# Patient Record
Sex: Male | Born: 1955 | Race: White | Hispanic: No | Marital: Married | Smoking: Never smoker
Health system: Southern US, Community
[De-identification: ages and names within clinical notes are randomized; demographics above are authoritative.]

## PROBLEM LIST (undated history)

## (undated) DIAGNOSIS — M542 Cervicalgia: Secondary | ICD-10-CM

## (undated) DIAGNOSIS — C801 Malignant (primary) neoplasm, unspecified: Secondary | ICD-10-CM

## (undated) DIAGNOSIS — I10 Essential (primary) hypertension: Secondary | ICD-10-CM

## (undated) HISTORY — PX: JOINT REPLACEMENT: SHX530

## (undated) HISTORY — PX: HERNIA REPAIR: SHX51

## (undated) HISTORY — PX: CARPAL TUNNEL RELEASE: SHX101

---

## 2010-08-15 LAB — APTT: aPTT: 32 seconds (ref 24–37)

## 2010-08-15 LAB — URINALYSIS, ROUTINE W REFLEX MICROSCOPIC
Bilirubin Urine: NEGATIVE
Hgb urine dipstick: NEGATIVE
Ketones, ur: NEGATIVE mg/dL
Nitrite: NEGATIVE
Protein, ur: NEGATIVE mg/dL
Specific Gravity, Urine: 1.007 (ref 1.005–1.030)
Urine Glucose, Fasting: NEGATIVE mg/dL
Urobilinogen, UA: 0.2 mg/dL (ref 0.0–1.0)
pH: 7 (ref 5.0–8.0)

## 2010-08-15 LAB — CBC
HCT: 42.5 % (ref 39.0–52.0)
Hemoglobin: 14.7 g/dL (ref 13.0–17.0)
MCH: 31.3 pg (ref 26.0–34.0)
MCHC: 34.6 g/dL (ref 30.0–36.0)
MCV: 90.6 fL (ref 78.0–100.0)
Platelets: 234 10*3/uL (ref 150–400)
RBC: 4.69 MIL/uL (ref 4.22–5.81)
RDW: 12.4 % (ref 11.5–15.5)
WBC: 7.1 10*3/uL (ref 4.0–10.5)

## 2010-08-15 LAB — COMPREHENSIVE METABOLIC PANEL
ALT: 31 U/L (ref 0–53)
AST: 19 U/L (ref 0–37)
Albumin: 4.3 g/dL (ref 3.5–5.2)
Alkaline Phosphatase: 65 U/L (ref 39–117)
BUN: 14 mg/dL (ref 6–23)
CO2: 29 mEq/L (ref 19–32)
Calcium: 9.8 mg/dL (ref 8.4–10.5)
Chloride: 105 mEq/L (ref 96–112)
Creatinine, Ser: 1.39 mg/dL (ref 0.4–1.5)
GFR calc Af Amer: >60 mL/min (ref >60)
GFR calc non Af Amer: 53 mL/min — ABNORMAL LOW (ref >60)
Glucose, Bld: 104 mg/dL — ABNORMAL HIGH (ref 70–99)
Potassium: 4.3 mEq/L (ref 3.5–5.1)
Sodium: 141 mEq/L (ref 135–145)
Total Bilirubin: 0.8 mg/dL (ref 0.3–1.2)
Total Protein: 7.5 g/dL (ref 6.0–8.3)

## 2010-08-15 LAB — SURGICAL PCR SCREEN
MRSA, PCR: NEGATIVE
Staphylococcus aureus: NEGATIVE

## 2010-08-15 LAB — PROTIME-INR
INR: 0.99 (ref 0.00–1.49)
Prothrombin Time: 13.3 seconds (ref 11.6–15.2)

## 2010-08-20 ENCOUNTER — Inpatient Hospital Stay (HOSPITAL_COMMUNITY)
Admission: RE | Admit: 2010-08-20 | Discharge: 2010-08-22 | Payer: Self-pay | Source: Home / Self Care | Attending: Orthopedic Surgery | Admitting: Orthopedic Surgery

## 2010-08-21 LAB — TYPE AND SCREEN
ABO/RH(D): O POS
Antibody Screen: NEGATIVE

## 2010-08-21 LAB — ABO/RH: ABO/RH(D): O POS

## 2010-08-22 LAB — BASIC METABOLIC PANEL
BUN: 7 mg/dL (ref 6–23)
BUN: 8 mg/dL (ref 6–23)
CO2: 29 mEq/L (ref 19–32)
CO2: 26 mEq/L (ref 19–32)
Calcium: 8.6 mg/dL (ref 8.4–10.5)
Calcium: 8.7 mg/dL (ref 8.4–10.5)
Chloride: 105 mEq/L (ref 96–112)
Chloride: 101 mEq/L (ref 96–112)
Creatinine, Ser: 1.19 mg/dL (ref 0.4–1.5)
Creatinine, Ser: 1.14 mg/dL (ref 0.4–1.5)
GFR calc Af Amer: >60 mL/min (ref >60)
GFR calc Af Amer: >60 mL/min (ref >60)
GFR calc non Af Amer: >60 mL/min (ref >60)
GFR calc non Af Amer: >60 mL/min (ref >60)
Glucose, Bld: 147 mg/dL — ABNORMAL HIGH (ref 70–99)
Glucose, Bld: 147 mg/dL — ABNORMAL HIGH (ref 70–99)
Potassium: 4.4 mEq/L (ref 3.5–5.1)
Potassium: 4 mEq/L (ref 3.5–5.1)
Sodium: 138 mEq/L (ref 135–145)
Sodium: 136 mEq/L (ref 135–145)

## 2010-08-22 LAB — CBC
HCT: 34.6 % — ABNORMAL LOW (ref 39.0–52.0)
HCT: 30.2 % — ABNORMAL LOW (ref 39.0–52.0)
Hemoglobin: 11.9 g/dL — ABNORMAL LOW (ref 13.0–17.0)
Hemoglobin: 10.7 g/dL — ABNORMAL LOW (ref 13.0–17.0)
MCH: 30.6 pg (ref 26.0–34.0)
MCH: 31.1 pg (ref 26.0–34.0)
MCHC: 34.4 g/dL (ref 30.0–36.0)
MCHC: 35.4 g/dL (ref 30.0–36.0)
MCV: 88.9 fL (ref 78.0–100.0)
MCV: 87.8 fL (ref 78.0–100.0)
Platelets: 211 10*3/uL (ref 150–400)
Platelets: 191 10*3/uL (ref 150–400)
RBC: 3.89 MIL/uL — ABNORMAL LOW (ref 4.22–5.81)
RBC: 3.44 MIL/uL — ABNORMAL LOW (ref 4.22–5.81)
RDW: 12.4 % (ref 11.5–15.5)
RDW: 12.4 % (ref 11.5–15.5)
WBC: 10.8 10*3/uL — ABNORMAL HIGH (ref 4.0–10.5)
WBC: 10 10*3/uL (ref 4.0–10.5)

## 2010-08-25 ENCOUNTER — Emergency Department (HOSPITAL_BASED_OUTPATIENT_CLINIC_OR_DEPARTMENT_OTHER)
Admission: EM | Admit: 2010-08-25 | Discharge: 2010-08-25 | Payer: Self-pay | Source: Home / Self Care | Admitting: Emergency Medicine

## 2010-08-29 NOTE — Op Note (Signed)
Antonio Wang, Antonio Wang                ACCOUNT NO.:  [id number]  MEDICAL RECORD NO.:  [id number]          PATIENT TYPE:  INP  LOCATION:  0004                         FACILITY:  [facility]  PHYSICIAN:  Ollen Gross, M.D.    DATE OF BIRTH:  1956/04/29  DATE OF PROCEDURE:  08/20/2010 DATE OF DISCHARGE:                              OPERATIVE REPORT   PREOPERATIVE DIAGNOSIS:  Osteoarthritis of the left knee.  POSTOPERATIVE DIAGNOSIS:  Osteoarthritis of the left knee.  PROCEDURE:  Left total-knee arthroplasty.  SURGEON:  Ollen Gross, M.D.  ASSISTANT:  Alexzandrew L. Perkins, P.A.C.  ANESTHESIA:  Spinal.  ESTIMATED BLOOD LOSS:  Minimal.  DRAINS:  Hemovac x1.  TOURNIQUET TIME:  47 minutes at 300 mmHg.  COMPLICATIONS:  None.  CONDITION:  Stable to recovery.  BRIEF CLINICAL NOTE:  Mr. Cookston is a 55 year old male with advanced arthritis of the left knee with progressively worsening pain and dysfunction.  He has failed nonoperative management and presents for a total-knee arthroplasty.  PROCEDURE IN DETAIL:  After successful administration of spinal anesthetic, a tourniquet was placed high on the left thigh and the left lower extremity was prepped and draped in the usual sterile fashion. Extremity was wrapped in Esmarch, knee flexed, tourniquet inflated to 300 mmHg.  Midline incision was made with a 10 blade through subcutaneous tissue to the level of the extensor mechanism.  A fresh blade was used to make a medial parapatellar arthrotomy.  Soft tissue on the proximal and medial tibia was subperiosteally elevated to the joint line with the knife and into the semimembranosus bursa with a Cobb elevator.  Soft tissue laterally was elevated with attention being paid to avoid the patellar tendon on the tibial tubercle.  The patella was everted, knee flexed 90 degrees and ACL and PCL removed.  Drill was used to create a starting hole in the distal femur and the canal was thoroughly  irrigated.  The 5 degrees left valgus alignment guide was placed and the distal femoral cutting block was placed to remove 11 mm off the distal femur.  Distal femoral resection was made with an oscillating saw.  Sizing guide was placed.  Size 5 was most appropriate. Rotation was marked off the epicondylar axis.  Size 5 cutting block was placed and the anterior, posterior and chamfer cuts were made.  Tibia subluxed forward and menisci removed.  Extramedullary tibial alignment guide was placed referencing proximally at the medial aspect of the tibial tubercle and distally along the second metatarsal axis and tibial crest.  Block was pinned to remove 2 mm off the more deficient medial side.  Tibial resection was made with an oscillating saw.  Size 5 was the most appropriate tibial component and the proximal tibia was prepared with the modular drill and keel punch for the size 5.  Femoral preparation was completed with the intercondylar cut.  Size 5 mobile bearing tibial trial, size 5 posterior stabilized femoral trial and a 10-mm posterior stabilized rotating platform insert trial was placed.  With the 10 there was a fair amount of laxity and we ended up going up to a 15, which  allowed for full extension with excellent varus-valgus and anterior-posterior balance throughout full range of motion.  Patella was everted, thickness measured to be 25 mm.  Freehand resection was taken to 32 mm, 41 template was placed, lug holes were drilled, trial patella was placed and it tracks normally.  Osteophytes were removed off the posterior femur with the trial in place.  All trials were removed and the cut bone surfaces were prepared with pulsatile lavage.  The cement was mixed and once ready for implantation the size 5 mobile bearing tibial tray, size 5 posterior stabilized femur and 41 patella were cemented into place and the patella was held with a clamp.  Trial 15-mm insert was placed, knee held in  full extension and all extruded cement was removed.  When the cement was fully hardened, then the permanent 15 mm posterior stabilized rotating platform insert was placed in the tibial tray.  The wound was copiously irrigated with saline solution and the arthrotomy was closed over a Hemovac drain with interrupted #1 PDS.  Flexion against gravity was 140 degrees and the patella tracks normally.  Subcutaneous was closed with interrupted 2-0 Vicryl and subcuticular running 4-0 Monocryl.  The catheter for the Marcaine pain pump was placed and the pump was initiated.  Incision was cleaned and dried and Steri-Strips and a bulky sterile dressing were applied.  He was then placed into a knee immobilizer, awakened and transferred to recovery in stable condition.     Ollen Gross, M.D.     FA/MEDQ  D:  08/20/2010  T:  08/20/2010  Job:  [id number]  Electronically Signed by Ollen Gross M.D. on 08/29/2010 03:36:22 PM

## 2010-08-29 NOTE — H&P (Signed)
  NAMEGEROLD, Antonio Wang                ACCOUNT NO.:  [id number]  MEDICAL RECORD NO.:  [id number]          PATIENT TYPE:  INP  LOCATION:  0004                         FACILITY:  [facility]  PHYSICIAN:  Ollen Gross, M.D.    DATE OF BIRTH:  July 10, 1956  DATE OF ADMISSION:  08/20/2010 DATE OF DISCHARGE:                             HISTORY & PHYSICAL   CHIEF COMPLAINT:  Left knee pain.  HISTORY OF PRESENT ILLNESS:  The patient is a 55 year old male who has been seen by Dr. Lequita Halt for ongoing left knee pain.  He has known progressive arthritis.  X-rays show that he has bone-on-bone changes in the medial compartment and significant chondrocalcinosis that has progressively gotten worse with increasing dysfunction.  He was felt to be a good candidate and was subsequently admitted to the hospital.  ALLERGIES:  PENICILLIN.  CURRENT MEDICATIONS:  Lipitor and meloxicam.  PAST MEDICAL HISTORY:  Impaired vision, history of bronchitis, history of pneumonia, sleep apnea, uses CPAP, elevated cholesterol, reflux disease, degenerative disk disease, childhood illnesses of measles.  PAST SURGICAL HISTORY:  Removal of torn meniscus left knee.  FAMILY HISTORY:  Father with hypertension, cholesterol, COPD, heart attack.  Mother with hypertension, heart attack and atherosclerosis. Brother with fibromyalgia.  SOCIAL HISTORY:  Married.  Sport and exercise psychologist.  Current smoker, two packs a day for 25 years.  Three beers a day with more on the weekends.  Four children.  Does have a caregiver lined up.  REVIEW OF SYSTEMS:  GENERAL:  No fevers, chills or night sweats. NEUROLOGIC:  No seizures, syncope or paralysis.  RESPIRATORY:  No shortness of breath, productive cough or hemoptysis.  CARDIOVASCULAR: No chest pain, no orthopnea.  GASTROINTESTINAL:  No nausea, vomiting, diarrhea or constipation.  GENITOURINARY:  No dysuria, hematuria or discharge.  MUSCULOSKELETAL:  Left knee.  PHYSICAL EXAMINATION:  VITAL SIGNS:   Pulse 76, respirations 14, blood pressure 120/82. GENERAL:  A 55 year old male, well-nourished, well-developed.  No acute distress.  He is alert, oriented and cooperative.  Good historian. Overweight. HEENT:  Normocephalic, atraumatic.  Pupils are equal, round and reactive.  EOMs intact.  He does not wear glasses. NECK:  Supple. CHEST:  Clear. HEART:  Regular rate and rhythm without murmur.  S1-S2 noted. ABDOMEN:  Soft, nontender.  Bowel sounds are present. RECTAL/BREAST/GENITALIA:  Not done as not pertinent to present illness. EXTREMITIES:  Left knee slight varus, range of motion 5 to 120, marked crepitus, tender more medial than lateral.  IMPRESSION:  Osteoarthritis of the left knee.  PLAN:  The patient was admitted to [facility] to undergo a left total-knee replacement arthroplasty.  Surgery will be performed by Dr. Ollen Gross.     [name], P.A.C.   ______________________________ Ollen Gross, M.D.    ALP/MEDQ  D:  08/20/2010  T:  08/20/2010  Job:  [id number] Electronically Signed by Patrica Duel P.A.C. on 08/23/2010 10:32:18 AM Electronically Signed by Ollen Gross M.D. on 08/29/2010 03:36:18 PM

## 2010-09-12 NOTE — Discharge Summary (Addendum)
Antonio Wang, Antonio Wang                ACCOUNT NO.:  [id number]  MEDICAL RECORD NO.:  [id number]           PATIENT TYPE:  I  LOCATION:  1619                         FACILITY:  [facility]  PHYSICIAN:  Ollen Gross, M.D.    DATE OF BIRTH:  [birth year]  DATE OF ADMISSION:  08/20/2010 DATE OF DISCHARGE:  08/22/2010                              DISCHARGE SUMMARY   ADMITTING DIAGNOSES: 1. Osteoarthritis, left knee. 2. Impaired vision. 3. History of bronchitis. 4. History of pneumonia. 5. Sleep apnea. 6. Elevated cholesterol. 7. Reflux disease. 8. Degenerative disk disease. 9. Childhood illnesses, measles.  DISCHARGE DIAGNOSES: 1. Osteoarthritis, left knee, status post left total knee replacement     arthroplasty. 2. Impaired vision. 3. History of bronchitis. 4. History of pneumonia. 5. Sleep apnea. 6. Elevated cholesterol. 7. Reflux disease. 8. Degenerative disk disease. 9. Childhood illnesses, measles.  PROCEDURE:  August 20, 2010, left total knee.  SURGEON:  Ollen Gross, M.D.  ASSISTANT:  Alexzandrew L. Perkins, P.A.C.  ANESTHESIA:  Spinal anesthesia.  TOURNIQUET TIME:  47 minutes.  CONSULTS:  None.  BRIEF HISTORY:  [name] is a 55 year old male with advanced arthritis of left knee with progressive worsening pain and dysfunction, failed nonoperative management, now presents for total knee arthroplasty.  LABORATORY DATA AND DIAGNOSTIC STUDIES:  Preop CBC showed hemoglobin of 14.7, hematocrit 42.5, white cell count 7.1, platelets 234,000.  Postop hemoglobin 11.9, last hemoglobin and hematocrit 10.7 and 30.2.  PT/INR 13.3 and 0.99, with a PTT of 32 on admission.  Chem panel on admission all within normal limits.  Serial BMETs were followed for electrolytes. Electrolytes all remained within normal limits x2.  Glucose did go up from 104 to 147.  Preop UA negative.  Blood type O+.  Nasal swabs were negative for Staphylococcus aureus and negative for MRSA.  EKG on  August 14, 2010; normal sinus rhythm, normal EKG, confirmed by Dr. [place] Bing.  Chest x-ray, two-view chest, on August 14, 2010; no acute cardiopulmonary process, bronchitic markings.  HOSPITAL COURSE:  The patient was admitted to [facility], taken to the OR, underwent above-stated procedure without complication. The patient tolerated the procedure well, later transferred to recovery room, then orthopedic floor, started on p.o. and IV analgesic pain control following surgery.  Overnight services did change his IV medications for better pain control.  We changed his p.o. medications on the morning of day #1.  We discontinued his Foley and had decent urinary output.  Hemoglobin was 11.9.  Hemovac drain placed in the surgery was pulled.  He was placed on Xarelto for DVT prophylaxis.  We started back his CPAP at night.  Here, he did very well with physical therapy on day #1.  By day #2, he was doing extremely well.  We did increase his pain pills though for a little bit stronger pain medication.  He was considering going home so what we did was allowed him therapy that morning and early afternoon.  We changed his dressing on day #2 also. Incision looked good.  Hemoglobin was 10.7.  He did very well with this therapy and later that day,  he was evaluated and discharged home.  DISCHARGE/PLAN:  The patient was discharged home on August 22, 2010.  DISCHARGE DIAGNOSIS:  Please see above.  DISCHARGE MEDICATIONS:  Oxy-IR 10, Robaxin 500 mg, Xarelto 10 mg. Continue home medications with Lipitor and Nasonex.  DIET:  As tolerated.  FOLLOWUP:  The patient needs to follow up in 2 weeks from date of surgery on Tuesday, February 7.  ACTIVITY:  Weightbearing as tolerated, total knee protocol.  DISPOSITION:  Home.  CONDITION ON DISCHARGE:  Improved.     [name], P.A.C.   ______________________________ Ollen Gross, M.D.   ALP/MEDQ  D:  09/04/2010  T:   09/04/2010  Job:  [id number]  Electronically Signed by Patrica Duel P.A.C. on 11/08/2010 11:10:33 AM Electronically Signed by Ollen Gross M.D. on 11/09/2010 09:00:45 AM

## 2010-09-17 ENCOUNTER — Ambulatory Visit: Payer: BC Managed Care – PPO | Attending: Orthopedic Surgery | Admitting: Physical Therapy

## 2010-09-17 DIAGNOSIS — M25669 Stiffness of unspecified knee, not elsewhere classified: Secondary | ICD-10-CM | POA: Insufficient documentation

## 2010-09-17 DIAGNOSIS — IMO0001 Reserved for inherently not codable concepts without codable children: Secondary | ICD-10-CM | POA: Insufficient documentation

## 2010-09-17 DIAGNOSIS — M25569 Pain in unspecified knee: Secondary | ICD-10-CM | POA: Insufficient documentation

## 2010-09-17 DIAGNOSIS — R262 Difficulty in walking, not elsewhere classified: Secondary | ICD-10-CM | POA: Insufficient documentation

## 2010-09-17 DIAGNOSIS — Z96659 Presence of unspecified artificial knee joint: Secondary | ICD-10-CM | POA: Insufficient documentation

## 2010-09-19 ENCOUNTER — Ambulatory Visit: Payer: BC Managed Care – PPO

## 2010-09-24 ENCOUNTER — Ambulatory Visit: Payer: BC Managed Care – PPO

## 2010-09-26 ENCOUNTER — Ambulatory Visit: Payer: BC Managed Care – PPO | Admitting: Rehabilitation

## 2010-09-27 ENCOUNTER — Ambulatory Visit: Payer: BC Managed Care – PPO | Attending: Orthopedic Surgery | Admitting: Rehabilitation

## 2010-09-27 DIAGNOSIS — M25669 Stiffness of unspecified knee, not elsewhere classified: Secondary | ICD-10-CM | POA: Insufficient documentation

## 2010-09-27 DIAGNOSIS — M25569 Pain in unspecified knee: Secondary | ICD-10-CM | POA: Insufficient documentation

## 2010-09-27 DIAGNOSIS — Z96659 Presence of unspecified artificial knee joint: Secondary | ICD-10-CM | POA: Insufficient documentation

## 2010-09-27 DIAGNOSIS — R262 Difficulty in walking, not elsewhere classified: Secondary | ICD-10-CM | POA: Insufficient documentation

## 2010-09-27 DIAGNOSIS — IMO0001 Reserved for inherently not codable concepts without codable children: Secondary | ICD-10-CM | POA: Insufficient documentation

## 2010-10-03 ENCOUNTER — Ambulatory Visit: Payer: BC Managed Care – PPO | Admitting: Physical Therapy

## 2010-10-10 ENCOUNTER — Ambulatory Visit: Payer: BC Managed Care – PPO | Admitting: Rehabilitation

## 2010-10-17 ENCOUNTER — Ambulatory Visit: Payer: BC Managed Care – PPO

## 2018-04-14 ENCOUNTER — Other Ambulatory Visit: Payer: Self-pay | Admitting: Neurological Surgery

## 2018-05-26 NOTE — Pre-Procedure Instructions (Signed)
Antonio Wang  05/26/2018      [id number] - [place], [place] - [address] [place] [address] Phone: [phone] Fax: [phone]  [place], [place] - [place] [address] Phone: [phone] Fax: [phone]    Your procedure is scheduled on Thursday November 14.  Report to [facility] Admitting at 7:00 A.M.  Call this number if you have problems the morning of surgery:  [phone]   Remember:  Do not eat or drink after midnight.    Take these medicines the morning of surgery with A SIP OF WATER: NONE  7 days prior to surgery STOP taking any Aspirin(unless otherwise instructed by your surgeon), Aleve, Naproxen, Ibuprofen, Motrin, Advil, Goody's, BC's, all herbal medications, fish oil, and all vitamins     Do not wear jewelry, make-up or nail polish.  Do not wear lotions, powders, or perfumes, or deodorant.  Do not shave 48 hours prior to surgery.  Men may shave face and neck.  Do not bring valuables to the hospital.  [facility] is not responsible for any belongings or valuables.  Contacts, dentures or bridgework may not be worn into surgery.  Leave your suitcase in the car.  After surgery it may be brought to your room.  For patients admitted to the hospital, discharge time will be determined by your treatment team.  Patients discharged the day of surgery will not be allowed to drive home.   Special instructions:    [facility]- Preparing For Surgery  Before surgery, you can play an important role. Because skin is not sterile, your skin needs to be as free of germs as possible. You can reduce the number of germs on your skin by washing with CHG (chlorahexidine gluconate) Soap before surgery.  CHG is an antiseptic cleaner which kills germs and bonds with the skin to continue killing germs even after washing.     Oral Hygiene is also important to reduce your risk of infection.  Remember - BRUSH YOUR TEETH THE MORNING OF SURGERY WITH YOUR REGULAR TOOTHPASTE  Please do not use if you have an allergy to CHG or antibacterial soaps. If your skin becomes reddened/irritated stop using the CHG.  Do not shave (including legs and underarms) for at least 48 hours prior to first CHG shower. It is OK to shave your face.  Please follow these instructions carefully.   1. Shower the NIGHT BEFORE SURGERY and the MORNING OF SURGERY with CHG.   2. If you chose to wash your hair, wash your hair first as usual with your normal shampoo.  3. After you shampoo, rinse your hair and body thoroughly to remove the shampoo.  4. Use CHG as you would any other liquid soap. You can apply CHG directly to the skin and wash gently with a scrungie or a clean washcloth.   5. Apply the CHG Soap to your body ONLY FROM THE NECK DOWN.  Do not use on open wounds or open sores. Avoid contact with your eyes, ears, mouth and genitals (private parts). Wash Face and genitals (private parts)  with your normal soap.  6. Wash thoroughly, paying special attention to the area where your surgery will be performed.  7. Thoroughly rinse your body with warm water from the neck down.  8. DO NOT shower/wash with your normal  soap after using and rinsing off the CHG Soap.  9. Pat yourself dry with a CLEAN TOWEL.  10. Wear CLEAN PAJAMAS to bed the night before surgery, wear comfortable clothes the morning of surgery  11. Place CLEAN SHEETS on your bed the night of your first shower and DO NOT SLEEP WITH PETS.    Day of Surgery:  Do not apply any deodorants/lotions.  Please wear clean clothes to the hospital/surgery center.   Remember to brush your teeth WITH YOUR REGULAR TOOTHPASTE.    Please read over the following fact sheets that you were given. Coughing and Deep Breathing, MRSA Information and Surgical Site Infection  Prevention

## 2018-05-27 ENCOUNTER — Inpatient Hospital Stay (HOSPITAL_COMMUNITY)
Admission: RE | Admit: 2018-05-27 | Discharge: 2018-05-27 | Disposition: A | Discharge status: Home / Self Care | Payer: Self-pay | Source: Ambulatory Visit

## 2018-06-02 ENCOUNTER — Encounter (HOSPITAL_COMMUNITY)
Admission: RE | Admit: 2018-06-02 | Discharge: 2018-06-02 | Disposition: A | Discharge status: Home / Self Care | Payer: 59 | Source: Ambulatory Visit | Attending: Neurological Surgery | Admitting: Neurological Surgery

## 2018-06-02 ENCOUNTER — Other Ambulatory Visit: Payer: Self-pay

## 2018-06-02 ENCOUNTER — Encounter (HOSPITAL_COMMUNITY): Payer: Self-pay

## 2018-06-02 DIAGNOSIS — Z01818 Encounter for other preprocedural examination: Secondary | ICD-10-CM | POA: Diagnosis present

## 2018-06-02 DIAGNOSIS — M542 Cervicalgia: Secondary | ICD-10-CM | POA: Diagnosis not present

## 2018-06-02 HISTORY — DX: Essential (primary) hypertension: I10

## 2018-06-02 HISTORY — DX: Malignant (primary) neoplasm, unspecified: C80.1

## 2018-06-02 LAB — BASIC METABOLIC PANEL
ANION GAP: 6 (ref 5–15)
BUN: 17 mg/dL (ref 8–23)
CALCIUM: 9.4 mg/dL (ref 8.9–10.3)
CO2: 26 mmol/L (ref 22–32)
Chloride: 107 mmol/L (ref 98–111)
Creatinine, Ser: 1.39 mg/dL — ABNORMAL HIGH (ref 0.61–1.24)
GFR calc Af Amer: >60 mL/min (ref >60)
GFR calc non Af Amer: 52 mL/min — ABNORMAL LOW (ref >60)
Glucose, Bld: 129 mg/dL — ABNORMAL HIGH (ref 70–99)
Potassium: 4 mmol/L (ref 3.5–5.1)
Sodium: 139 mmol/L (ref 135–145)

## 2018-06-02 LAB — CBC WITH DIFFERENTIAL/PLATELET
ABS IMMATURE GRANULOCYTES: 0.04 10*3/uL (ref 0.00–0.07)
BASOS ABS: 0.1 10*3/uL (ref 0.0–0.1)
Basophils Relative: 1 %
Eosinophils Absolute: 0.4 10*3/uL (ref 0.0–0.5)
Eosinophils Relative: 5 %
HCT: 41.3 % (ref 39.0–52.0)
Hemoglobin: 13.8 g/dL (ref 13.0–17.0)
IMMATURE GRANULOCYTES: 1 %
Lymphocytes Relative: 25 %
Lymphs Abs: 2 10*3/uL (ref 0.7–4.0)
MCH: 31.5 pg (ref 26.0–34.0)
MCHC: 33.4 g/dL (ref 30.0–36.0)
MCV: 94.3 fL (ref 80.0–100.0)
Monocytes Absolute: 0.8 10*3/uL (ref 0.1–1.0)
Monocytes Relative: 11 %
NEUTROS ABS: 4.7 10*3/uL (ref 1.7–7.7)
NEUTROS PCT: 57 %
NRBC: 0 % (ref 0.0–0.2)
PLATELETS: 204 10*3/uL (ref 150–400)
RBC: 4.38 MIL/uL (ref 4.22–5.81)
RDW: 12.5 % (ref 11.5–15.5)
WBC: 8 10*3/uL (ref 4.0–10.5)

## 2018-06-02 LAB — PROTIME-INR
INR: 0.96
PROTHROMBIN TIME: 12.7 s (ref 11.4–15.2)

## 2018-06-02 LAB — SURGICAL PCR SCREEN
MRSA, PCR: NEGATIVE
Staphylococcus aureus: NEGATIVE

## 2018-06-02 NOTE — Progress Notes (Signed)
PCP: Charlcie Cradle  Cardiologist: denies  DM: denies  SA: yes, wears CPAP  Stress Test: >15 years   Pt denies SOB, cough, fever, chest pain  Pt states understanding of instructions given for day of surgery.

## 2018-06-11 ENCOUNTER — Encounter (HOSPITAL_COMMUNITY): Payer: Self-pay

## 2018-06-11 ENCOUNTER — Inpatient Hospital Stay (HOSPITAL_COMMUNITY)
Admission: RE | Disposition: A | Discharge status: Home / Self Care | Payer: Self-pay | Source: Home / Self Care | Attending: Neurological Surgery

## 2018-06-11 ENCOUNTER — Inpatient Hospital Stay (HOSPITAL_COMMUNITY): Payer: 59 | Admitting: Anesthesiology

## 2018-06-11 ENCOUNTER — Inpatient Hospital Stay (HOSPITAL_COMMUNITY): Payer: 59 | Admitting: Physician Assistant

## 2018-06-11 ENCOUNTER — Inpatient Hospital Stay (HOSPITAL_COMMUNITY)
Admission: RE | Admit: 2018-06-11 | Discharge: 2018-06-12 | DRG: 472 | Disposition: A | Discharge status: Home / Self Care | Payer: 59 | Attending: Neurological Surgery | Admitting: Neurological Surgery

## 2018-06-11 ENCOUNTER — Other Ambulatory Visit: Payer: Self-pay

## 2018-06-11 ENCOUNTER — Observation Stay (HOSPITAL_COMMUNITY): Payer: 59

## 2018-06-11 DIAGNOSIS — Z85820 Personal history of malignant melanoma of skin: Secondary | ICD-10-CM

## 2018-06-11 DIAGNOSIS — Z885 Allergy status to narcotic agent status: Secondary | ICD-10-CM

## 2018-06-11 DIAGNOSIS — Z79899 Other long term (current) drug therapy: Secondary | ICD-10-CM

## 2018-06-11 DIAGNOSIS — F17290 Nicotine dependence, other tobacco product, uncomplicated: Secondary | ICD-10-CM | POA: Diagnosis present

## 2018-06-11 DIAGNOSIS — M4802 Spinal stenosis, cervical region: Principal | ICD-10-CM | POA: Diagnosis present

## 2018-06-11 DIAGNOSIS — Z981 Arthrodesis status: Secondary | ICD-10-CM

## 2018-06-11 DIAGNOSIS — Z882 Allergy status to sulfonamides status: Secondary | ICD-10-CM

## 2018-06-11 DIAGNOSIS — M50221 Other cervical disc displacement at C4-C5 level: Secondary | ICD-10-CM | POA: Diagnosis present

## 2018-06-11 DIAGNOSIS — Z96659 Presence of unspecified artificial knee joint: Secondary | ICD-10-CM | POA: Diagnosis present

## 2018-06-11 DIAGNOSIS — M47812 Spondylosis without myelopathy or radiculopathy, cervical region: Secondary | ICD-10-CM | POA: Diagnosis present

## 2018-06-11 DIAGNOSIS — Z419 Encounter for procedure for purposes other than remedying health state, unspecified: Secondary | ICD-10-CM

## 2018-06-11 DIAGNOSIS — Z6841 Body Mass Index (BMI) 40.0 and over, adult: Secondary | ICD-10-CM

## 2018-06-11 DIAGNOSIS — I10 Essential (primary) hypertension: Secondary | ICD-10-CM | POA: Diagnosis present

## 2018-06-11 DIAGNOSIS — Z880 Allergy status to penicillin: Secondary | ICD-10-CM

## 2018-06-11 DIAGNOSIS — M2578 Osteophyte, vertebrae: Secondary | ICD-10-CM | POA: Diagnosis present

## 2018-06-11 HISTORY — PX: ANTERIOR CERVICAL DECOMP/DISCECTOMY FUSION: SHX1161

## 2018-06-11 HISTORY — DX: Cervicalgia: M54.2

## 2018-06-11 SURGERY — ANTERIOR CERVICAL DECOMPRESSION/DISCECTOMY FUSION 3 LEVELS
Anesthesia: General

## 2018-06-11 MED ORDER — THROMBIN 5000 UNITS EX SOLR
OROMUCOSAL | Status: DC | PRN
  Administered 2018-06-11 (×2): via TOPICAL

## 2018-06-11 MED ORDER — ONDANSETRON HCL 4 MG PO TABS
4.0000 mg | ORAL_TABLET | Freq: Four times a day (QID) | ORAL | Status: DC | PRN
  Administered 2018-06-11: 4 mg via ORAL
  Filled 2018-06-11: qty 1

## 2018-06-11 MED ORDER — PHENYLEPHRINE 40 MCG/ML (10ML) SYRINGE FOR IV PUSH (FOR BLOOD PRESSURE SUPPORT)
PREFILLED_SYRINGE | INTRAVENOUS | Status: AC
  Filled 2018-06-11: qty 10

## 2018-06-11 MED ORDER — ROCURONIUM BROMIDE 50 MG/5ML IV SOSY
PREFILLED_SYRINGE | INTRAVENOUS | Status: AC
  Filled 2018-06-11: qty 5

## 2018-06-11 MED ORDER — SODIUM CHLORIDE 0.9 % IV SOLN
INTRAVENOUS | Status: DC | PRN
  Administered 2018-06-11: 09:00:00

## 2018-06-11 MED ORDER — THROMBIN (RECOMBINANT) 20000 UNITS EX SOLR
CUTANEOUS | Status: AC
  Filled 2018-06-11: qty 20000

## 2018-06-11 MED ORDER — MEPERIDINE HCL 50 MG/ML IJ SOLN: 6.2500 mg | INTRAMUSCULAR | Status: DC | PRN

## 2018-06-11 MED ORDER — BUPIVACAINE HCL (PF) 0.25 % IJ SOLN
INTRAMUSCULAR | Status: AC
  Filled 2018-06-11: qty 30

## 2018-06-11 MED ORDER — CHLORHEXIDINE GLUCONATE CLOTH 2 % EX PADS: 6.0000 | MEDICATED_PAD | Freq: Once | CUTANEOUS | Status: DC

## 2018-06-11 MED ORDER — DEXAMETHASONE 4 MG PO TABS
4.0000 mg | ORAL_TABLET | Freq: Four times a day (QID) | ORAL | Status: DC
  Administered 2018-06-11 – 2018-06-12 (×3): 4 mg via ORAL
  Filled 2018-06-11 (×3): qty 1

## 2018-06-11 MED ORDER — 0.9 % SODIUM CHLORIDE (POUR BTL) OPTIME
TOPICAL | Status: DC | PRN
  Administered 2018-06-11: 1000 mL

## 2018-06-11 MED ORDER — DEXAMETHASONE SODIUM PHOSPHATE 10 MG/ML IJ SOLN
INTRAMUSCULAR | Status: AC
  Filled 2018-06-11: qty 1

## 2018-06-11 MED ORDER — VANCOMYCIN HCL 10 G IV SOLR
1500.0000 mg | INTRAVENOUS | Status: AC
  Administered 2018-06-11: 1500 mg via INTRAVENOUS
  Filled 2018-06-11: qty 1500

## 2018-06-11 MED ORDER — FAMOTIDINE 20 MG PO TABS
20.0000 mg | ORAL_TABLET | Freq: Two times a day (BID) | ORAL | Status: DC
  Administered 2018-06-11: 20 mg via ORAL
  Filled 2018-06-11 (×4): qty 1

## 2018-06-11 MED ORDER — LIDOCAINE 2% (20 MG/ML) 5 ML SYRINGE
INTRAMUSCULAR | Status: DC | PRN
  Administered 2018-06-11: 100 mg via INTRAVENOUS

## 2018-06-11 MED ORDER — METHOCARBAMOL 500 MG PO TABS
ORAL_TABLET | ORAL | Status: AC
  Filled 2018-06-11: qty 1

## 2018-06-11 MED ORDER — SODIUM CHLORIDE 0.9 % IV SOLN: 250.0000 mL | INTRAVENOUS | Status: DC

## 2018-06-11 MED ORDER — HYDROMORPHONE HCL 1 MG/ML IJ SOLN
0.2500 mg | INTRAMUSCULAR | Status: DC | PRN
  Administered 2018-06-11 (×4): 0.5 mg via INTRAVENOUS

## 2018-06-11 MED ORDER — VANCOMYCIN HCL 10 G IV SOLR
1250.0000 mg | Freq: Once | INTRAVENOUS | Status: AC
  Administered 2018-06-11: 1250 mg via INTRAVENOUS
  Filled 2018-06-11: qty 1250

## 2018-06-11 MED ORDER — SODIUM CHLORIDE 0.9% FLUSH
3.0000 mL | Freq: Two times a day (BID) | INTRAVENOUS | Status: DC
  Administered 2018-06-11: 3 mL via INTRAVENOUS

## 2018-06-11 MED ORDER — ONDANSETRON HCL 4 MG/2ML IJ SOLN: 4.0000 mg | Freq: Four times a day (QID) | INTRAMUSCULAR | Status: DC | PRN

## 2018-06-11 MED ORDER — MORPHINE SULFATE (PF) 2 MG/ML IV SOLN: 2.0000 mg | INTRAVENOUS | Status: DC | PRN

## 2018-06-11 MED ORDER — MENTHOL 3 MG MT LOZG
1.0000 | LOZENGE | OROMUCOSAL | Status: DC | PRN
  Filled 2018-06-11: qty 9

## 2018-06-11 MED ORDER — DEXAMETHASONE SODIUM PHOSPHATE 4 MG/ML IJ SOLN: 4.0000 mg | Freq: Four times a day (QID) | INTRAMUSCULAR | Status: DC

## 2018-06-11 MED ORDER — POTASSIUM CHLORIDE IN NACL 20-0.9 MEQ/L-% IV SOLN: INTRAVENOUS | Status: DC

## 2018-06-11 MED ORDER — SODIUM CHLORIDE 0.9% FLUSH: 3.0000 mL | INTRAVENOUS | Status: DC | PRN

## 2018-06-11 MED ORDER — ONDANSETRON HCL 4 MG/2ML IJ SOLN
INTRAMUSCULAR | Status: DC | PRN
  Administered 2018-06-11: 4 mg via INTRAVENOUS

## 2018-06-11 MED ORDER — SUCCINYLCHOLINE CHLORIDE 200 MG/10ML IV SOSY
PREFILLED_SYRINGE | INTRAVENOUS | Status: AC
  Filled 2018-06-11: qty 10

## 2018-06-11 MED ORDER — ONDANSETRON HCL 4 MG/2ML IJ SOLN
INTRAMUSCULAR | Status: AC
  Filled 2018-06-11: qty 2

## 2018-06-11 MED ORDER — HYDROCODONE-ACETAMINOPHEN 10-325 MG PO TABS
1.0000 | ORAL_TABLET | ORAL | Status: DC | PRN
  Administered 2018-06-11: 1 via ORAL
  Filled 2018-06-11: qty 1

## 2018-06-11 MED ORDER — LIDOCAINE 2% (20 MG/ML) 5 ML SYRINGE
INTRAMUSCULAR | Status: AC
  Filled 2018-06-11: qty 5

## 2018-06-11 MED ORDER — LACTATED RINGERS IV SOLN
INTRAVENOUS | Status: DC
  Administered 2018-06-11: 08:00:00 via INTRAVENOUS

## 2018-06-11 MED ORDER — FENTANYL CITRATE (PF) 100 MCG/2ML IJ SOLN
INTRAMUSCULAR | Status: DC | PRN
  Administered 2018-06-11 (×2): 50 ug via INTRAVENOUS
  Administered 2018-06-11: 150 ug via INTRAVENOUS
  Administered 2018-06-11 (×2): 50 ug via INTRAVENOUS

## 2018-06-11 MED ORDER — ACETAMINOPHEN 650 MG RE SUPP: 650.0000 mg | RECTAL | Status: DC | PRN

## 2018-06-11 MED ORDER — HYDROMORPHONE HCL 1 MG/ML IJ SOLN
INTRAMUSCULAR | Status: AC
  Filled 2018-06-11: qty 1

## 2018-06-11 MED ORDER — HYDROCODONE-ACETAMINOPHEN 10-325 MG PO TABS
1.0000 | ORAL_TABLET | ORAL | Status: DC | PRN
  Administered 2018-06-11 – 2018-06-12 (×2): 2 via ORAL
  Filled 2018-06-11 (×2): qty 2

## 2018-06-11 MED ORDER — BUPIVACAINE HCL (PF) 0.25 % IJ SOLN
INTRAMUSCULAR | Status: DC | PRN
  Administered 2018-06-11: 6 mL

## 2018-06-11 MED ORDER — ROCURONIUM BROMIDE 50 MG/5ML IV SOSY
PREFILLED_SYRINGE | INTRAVENOUS | Status: DC | PRN
  Administered 2018-06-11: 80 mg via INTRAVENOUS
  Administered 2018-06-11: 20 mg via INTRAVENOUS

## 2018-06-11 MED ORDER — THROMBIN 20000 UNITS EX SOLR
CUTANEOUS | Status: DC | PRN
  Administered 2018-06-11: 09:00:00 via TOPICAL

## 2018-06-11 MED ORDER — LOSARTAN POTASSIUM 50 MG PO TABS
50.0000 mg | ORAL_TABLET | Freq: Every day | ORAL | Status: DC
  Administered 2018-06-12: 50 mg via ORAL
  Filled 2018-06-11: qty 1

## 2018-06-11 MED ORDER — ONDANSETRON HCL 4 MG/2ML IJ SOLN: 4.0000 mg | Freq: Once | INTRAMUSCULAR | Status: DC | PRN

## 2018-06-11 MED ORDER — DEXAMETHASONE SODIUM PHOSPHATE 10 MG/ML IJ SOLN
10.0000 mg | INTRAMUSCULAR | Status: AC
  Administered 2018-06-11: 10 mg via INTRAVENOUS

## 2018-06-11 MED ORDER — METHOCARBAMOL 500 MG PO TABS
500.0000 mg | ORAL_TABLET | Freq: Four times a day (QID) | ORAL | Status: DC | PRN
  Administered 2018-06-11 – 2018-06-12 (×3): 500 mg via ORAL
  Filled 2018-06-11 (×2): qty 1

## 2018-06-11 MED ORDER — METHOCARBAMOL 1000 MG/10ML IJ SOLN
500.0000 mg | Freq: Four times a day (QID) | INTRAVENOUS | Status: DC | PRN
  Filled 2018-06-11: qty 5

## 2018-06-11 MED ORDER — THROMBIN 5000 UNITS EX SOLR
CUTANEOUS | Status: AC
  Filled 2018-06-11: qty 5000

## 2018-06-11 MED ORDER — SUGAMMADEX SODIUM 200 MG/2ML IV SOLN
INTRAVENOUS | Status: AC
  Filled 2018-06-11: qty 2

## 2018-06-11 MED ORDER — MIDAZOLAM HCL 5 MG/5ML IJ SOLN
INTRAMUSCULAR | Status: DC | PRN
  Administered 2018-06-11: 2 mg via INTRAVENOUS

## 2018-06-11 MED ORDER — ALUM & MAG HYDROXIDE-SIMETH 200-200-20 MG/5ML PO SUSP
30.0000 mL | Freq: Four times a day (QID) | ORAL | Status: DC | PRN
  Administered 2018-06-11: 30 mL via ORAL
  Filled 2018-06-11: qty 30

## 2018-06-11 MED ORDER — PHENOL 1.4 % MT LIQD: 1.0000 | OROMUCOSAL | Status: DC | PRN

## 2018-06-11 MED ORDER — ACETAMINOPHEN 325 MG PO TABS: 650.0000 mg | ORAL_TABLET | ORAL | Status: DC | PRN

## 2018-06-11 MED ORDER — FENTANYL CITRATE (PF) 250 MCG/5ML IJ SOLN
INTRAMUSCULAR | Status: AC
  Filled 2018-06-11: qty 5

## 2018-06-11 MED ORDER — PHENYLEPHRINE 40 MCG/ML (10ML) SYRINGE FOR IV PUSH (FOR BLOOD PRESSURE SUPPORT)
PREFILLED_SYRINGE | INTRAVENOUS | Status: DC | PRN
  Administered 2018-06-11: 80 ug via INTRAVENOUS

## 2018-06-11 MED ORDER — PROPOFOL 10 MG/ML IV BOLUS
INTRAVENOUS | Status: DC | PRN
  Administered 2018-06-11: 50 mg via INTRAVENOUS
  Administered 2018-06-11: 150 mg via INTRAVENOUS

## 2018-06-11 MED ORDER — CEFAZOLIN SODIUM-DEXTROSE 2-4 GM/100ML-% IV SOLN
INTRAVENOUS | Status: AC
  Filled 2018-06-11: qty 100

## 2018-06-11 MED ORDER — SODIUM CHLORIDE 0.9 % IV SOLN
INTRAVENOUS | Status: DC | PRN
  Administered 2018-06-11: 25 ug/min via INTRAVENOUS

## 2018-06-11 MED ORDER — PROPOFOL 10 MG/ML IV BOLUS
INTRAVENOUS | Status: AC
  Filled 2018-06-11: qty 20

## 2018-06-11 MED ORDER — MIDAZOLAM HCL 2 MG/2ML IJ SOLN
INTRAMUSCULAR | Status: AC
  Filled 2018-06-11: qty 2

## 2018-06-11 MED ORDER — SENNA 8.6 MG PO TABS
1.0000 | ORAL_TABLET | Freq: Two times a day (BID) | ORAL | Status: DC
  Administered 2018-06-11 – 2018-06-12 (×2): 8.6 mg via ORAL
  Filled 2018-06-11 (×2): qty 1

## 2018-06-11 MED ORDER — SUCCINYLCHOLINE CHLORIDE 20 MG/ML IJ SOLN
INTRAMUSCULAR | Status: DC | PRN
  Administered 2018-06-11: 140 mg via INTRAVENOUS

## 2018-06-11 MED ORDER — SUGAMMADEX SODIUM 200 MG/2ML IV SOLN
INTRAVENOUS | Status: DC | PRN
  Administered 2018-06-11: 200 mg via INTRAVENOUS

## 2018-06-11 SURGICAL SUPPLY — 57 items
ADH SKN CLS APL DERMABOND .7 (GAUZE/BANDAGES/DRESSINGS) ×1
APL SKNCLS STERI-STRIP NONHPOA (GAUZE/BANDAGES/DRESSINGS) ×1
BAG DECANTER FOR FLEXI CONT (MISCELLANEOUS) ×3 IMPLANT
BASKET BONE COLLECTION (BASKET) IMPLANT
BENZOIN TINCTURE PRP APPL 2/3 (GAUZE/BANDAGES/DRESSINGS) ×3 IMPLANT
BUR MATCHSTICK NEURO 3.0 LAGG (BURR) ×3 IMPLANT
CANISTER SUCT 3000ML PPV (MISCELLANEOUS) ×3 IMPLANT
CARTRIDGE OIL MAESTRO DRILL (MISCELLANEOUS) ×1 IMPLANT
CLOSURE WOUND 1/2 X4 (GAUZE/BANDAGES/DRESSINGS) ×1
COVER WAND RF STERILE (DRAPES) ×3 IMPLANT
DERMABOND ADVANCED (GAUZE/BANDAGES/DRESSINGS) ×2
DERMABOND ADVANCED .7 DNX12 (GAUZE/BANDAGES/DRESSINGS) IMPLANT
DIFFUSER DRILL AIR PNEUMATIC (MISCELLANEOUS) ×3 IMPLANT
DRAPE C-ARM 42X72 X-RAY (DRAPES) ×6 IMPLANT
DRAPE LAPAROTOMY 100X72 PEDS (DRAPES) ×3 IMPLANT
DRAPE MICROSCOPE LEICA (MISCELLANEOUS) ×3 IMPLANT
DRSG OPSITE POSTOP 4X6 (GAUZE/BANDAGES/DRESSINGS) ×2 IMPLANT
DURAPREP 6ML APPLICATOR 50/CS (WOUND CARE) ×3 IMPLANT
ELECT COATED BLADE 2.86 ST (ELECTRODE) ×3 IMPLANT
ELECT REM PT RETURN 9FT ADLT (ELECTROSURGICAL) ×3
ELECTRODE REM PT RTRN 9FT ADLT (ELECTROSURGICAL) ×1 IMPLANT
GAUZE 4X4 16PLY RFD (DISPOSABLE) IMPLANT
GLOVE BIO SURGEON STRL SZ7 (GLOVE) IMPLANT
GLOVE BIO SURGEON STRL SZ8 (GLOVE) ×5 IMPLANT
GLOVE BIOGEL PI IND STRL 7.0 (GLOVE) IMPLANT
GLOVE BIOGEL PI INDICATOR 7.0 (GLOVE)
GOWN STRL REUS W/ TWL LRG LVL3 (GOWN DISPOSABLE) IMPLANT
GOWN STRL REUS W/ TWL XL LVL3 (GOWN DISPOSABLE) ×1 IMPLANT
GOWN STRL REUS W/TWL 2XL LVL3 (GOWN DISPOSABLE) IMPLANT
GOWN STRL REUS W/TWL LRG LVL3 (GOWN DISPOSABLE)
GOWN STRL REUS W/TWL XL LVL3 (GOWN DISPOSABLE) ×6
HEMOSTAT POWDER KIT SURGIFOAM (HEMOSTASIS) ×3 IMPLANT
KIT BASIN OR (CUSTOM PROCEDURE TRAY) ×3 IMPLANT
KIT TURNOVER KIT B (KITS) ×3 IMPLANT
NDL HYPO 25X1 1.5 SAFETY (NEEDLE) ×1 IMPLANT
NDL SPNL 20GX3.5 QUINCKE YW (NEEDLE) ×1 IMPLANT
NEEDLE HYPO 25X1 1.5 SAFETY (NEEDLE) ×3 IMPLANT
NEEDLE SPNL 20GX3.5 QUINCKE YW (NEEDLE) ×3 IMPLANT
NS IRRIG 1000ML POUR BTL (IV SOLUTION) ×3 IMPLANT
OIL CARTRIDGE MAESTRO DRILL (MISCELLANEOUS) ×3
PACK LAMINECTOMY NEURO (CUSTOM PROCEDURE TRAY) ×3 IMPLANT
PAD ARMBOARD 7.5X6 YLW CONV (MISCELLANEOUS) ×3 IMPLANT
PIN DISTRACTION 14MM (PIN) ×6 IMPLANT
PLATE 51MM (Plate) ×3 IMPLANT
PLATE 51X3 LVL NS SPNE CVD (Plate) IMPLANT
RUBBERBAND STERILE (MISCELLANEOUS) ×6 IMPLANT
SCREW 16MM (Screw) ×16 IMPLANT
SPACER IDENTITI 7X18X16 7D (Spacer) ×2 IMPLANT
SPACER PTI-C 8X18X16 7D (Spacer) ×4 IMPLANT
SPONGE INTESTINAL PEANUT (DISPOSABLE) ×3 IMPLANT
SPONGE SURGIFOAM ABS GEL 100 (HEMOSTASIS) ×3 IMPLANT
STRIP CLOSURE SKIN 1/2X4 (GAUZE/BANDAGES/DRESSINGS) ×2 IMPLANT
SUT VIC AB 3-0 SH 8-18 (SUTURE) ×3 IMPLANT
SUT VICRYL 4-0 PS2 18IN ABS (SUTURE) IMPLANT
TOWEL GREEN STERILE (TOWEL DISPOSABLE) ×3 IMPLANT
TOWEL GREEN STERILE FF (TOWEL DISPOSABLE) ×3 IMPLANT
WATER STERILE IRR 1000ML POUR (IV SOLUTION) ×3 IMPLANT

## 2018-06-11 NOTE — Anesthesia Postprocedure Evaluation (Signed)
Anesthesia Post Note  Patient: Antonio Wang  Procedure(s) Performed: Anterior Cervical Decompression Fusion  Cervical four-five Cervical five-six Cervical six -seven (N/A )     Patient location during evaluation: PACU Anesthesia Type: General Level of consciousness: awake and alert Pain management: pain level controlled Vital Signs Assessment: post-procedure vital signs reviewed and stable Respiratory status: spontaneous breathing, nonlabored ventilation, respiratory function stable and patient connected to nasal cannula oxygen Cardiovascular status: blood pressure returned to baseline and stable Postop Assessment: no apparent nausea or vomiting Anesthetic complications: no    Last Vitals:  Vitals:   06/11/18 1400 06/11/18 1405  BP:  129/63  Pulse: 89 89  Resp: 18 17  Temp:    SpO2: 96% 90%    Last Pain:  Vitals:   06/11/18 1405  PainSc: 2                  Seaira Byus DAVID

## 2018-06-11 NOTE — H&P (Signed)
Subjective:   Patient is a 62 y.o. male admitted for neck and B arm pain. The patient first presented to me with complaints of neck pain and shooting pains in the arm(s). Onset of symptoms was several months ago. The pain is described as aching and occurs all day. The pain is rated severe, and is located in the neck and radiates to the arms. The symptoms have been progressive. Symptoms are exacerbated by extending head backwards, and are relieved by none.  Previous work up includes MRI of cervical spine, results: spinal stenosis.  Past Medical History:  Diagnosis Date  . Cancer ([place])    melanoma - MOHS surgery  . Cervicalgia   . Hypertension     Past Surgical History:  Procedure Laterality Date  . CARPAL TUNNEL RELEASE Bilateral   . HERNIA REPAIR    . JOINT REPLACEMENT     knee    Allergies  Allergen Reactions  . Oxycodone Hives    Dilaudid is ok to take    . Penicillins Swelling and Other (See Comments)    Has patient had a PCN reaction causing immediate rash, facial/tongue/throat swelling, SOB or lightheadedness with hypotension: Unknown Has patient had a PCN reaction causing severe rash involving mucus membranes or skin necrosis: No Has patient had a PCN reaction that required hospitalization: Yes Has patient had a PCN reaction occurring within the last 10 years: No If all of the above answers are "NO", then may proceed with Cephalosporin use.   . Sulfa Antibiotics Other (See Comments)    Unknown    Social History   Tobacco Use  . Smoking status: Never Smoker  . Smokeless tobacco: Current User    Types: Snuff  Substance Use Topics  . Alcohol use: Yes    Alcohol/week: 24.0 standard drinks    Types: 24 Cans of beer per week    History reviewed. No pertinent family history. Prior to Admission medications   Medication Sig Start Date End Date Taking? Authorizing Provider  atorvastatin (LIPITOR) 40 MG tablet Take 40 mg by mouth daily.   Yes [provider]   losartan (COZAAR) 50 MG tablet Take 50 mg by mouth daily.   Yes [provider]  TURMERIC CURCUMIN PO Take 2 capsules by mouth daily.   Yes [provider]     Review of Systems  Positive ROS: neg  All other systems have been reviewed and were otherwise negative with the exception of those mentioned in the HPI and as above.  Objective: Vital signs in last 24 hours: Temp:  [97.6 F (36.4 C)] 97.6 F (36.4 C) (11/14 0718) Pulse Rate:  [90] 90 (11/14 0718) Resp:  [20] 20 (11/14 0718) BP: (155)/(87) 155/87 (11/14 0719) SpO2:  [97 %] 97 % (11/14 0718) Weight:  [141 kg] 141 kg (11/14 0740)  General Appearance: Alert, cooperative, no distress, appears stated age Head: Normocephalic, without obvious abnormality, atraumatic Eyes: PERRL, conjunctiva/corneas clear, EOM's intact      Neck: Supple, symmetrical, trachea midline, Back: Symmetric, no curvature, ROM normal, no CVA tenderness Lungs:  respirations unlabored Heart: Regular rate and rhythm Abdomen: Soft, non-tender Extremities: Extremities normal, atraumatic, no cyanosis or edema Pulses: 2+ and symmetric all extremities Skin: Skin color, texture, turgor normal, no rashes or lesions  NEUROLOGIC:  Mental status: Alert and oriented x4, no aphasia, good attention span, fund of knowledge and memory  Motor Exam - grossly normal Sensory Exam - grossly normal Reflexes: 1+ Coordination - grossly normal Gait - grossly  normal Balance - grossly normal Cranial Nerves: I: smell Not tested  II: visual acuity  OS: nl    OD: nl  II: visual fields Full to confrontation  II: pupils Equal, round, reactive to light  III,VII: ptosis None  III,IV,VI: extraocular muscles  Full ROM  V: mastication Normal  V: facial light touch sensation  Normal  V,VII: corneal reflex  Present  VII: facial muscle function - upper  Normal  VII: facial muscle function - lower Normal  VIII: hearing Not tested  IX: soft palate elevation   Normal  IX,X: gag reflex Present  XI: trapezius strength  5/5  XI: sternocleidomastoid strength 5/5  XI: neck flexion strength  5/5  XII: tongue strength  Normal    Data Review Lab Results  Component Value Date   WBC 8.0 06/02/2018   HGB 13.8 06/02/2018   HCT 41.3 06/02/2018   MCV 94.3 06/02/2018   PLT 204 06/02/2018   Lab Results  Component Value Date   NA 139 06/02/2018   K 4.0 06/02/2018   CL 107 06/02/2018   CO2 26 06/02/2018   BUN 17 06/02/2018   CREATININE 1.39 (H) 06/02/2018   GLUCOSE 129 (H) 06/02/2018   Lab Results  Component Value Date   INR 0.96 06/02/2018    Assessment:   Cervical neck pain with herniated nucleus pulposus/ 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 Estimated body mass index is 43.97 kg/m as calculated from the following:   Height as of this encounter: 5' 10.5" (1.791 m).   Weight as of this encounter: 141 kg.  Patient has failed conservative therapy. Planned surgery : ACDF C4-5 C5-6 C6-7  Plan:   I explained the condition and procedure to the patient and answered any questions.  Patient wishes to proceed with procedure as planned. Understands risks/ benefits/ and expected or typical outcomes.  Nara Paternoster S 06/11/2018 9:12 AM

## 2018-06-11 NOTE — Transfer of Care (Signed)
Immediate Anesthesia Transfer of Care Note  Patient: Antonio Wang  Procedure(s) Performed: Anterior Cervical Decompression Fusion  Cervical four-five Cervical five-six Cervical six -seven (N/A )  Patient Location: PACU  Anesthesia Type:General  Level of Consciousness: awake, alert  and oriented  Airway & Oxygen Therapy: Patient Spontanous Breathing and Patient connected to nasal cannula oxygen  Post-op Assessment: Report given to RN, Post -op Vital signs reviewed and stable and Patient moving all extremities X 4  Post vital signs: Reviewed and stable  Last Vitals:  Vitals Value Taken Time  BP 116/57 06/11/2018 12:53 PM  Temp    Pulse 87 06/11/2018 12:59 PM  Resp 19 06/11/2018 12:59 PM  SpO2 94 % 06/11/2018 12:59 PM  Vitals shown include unvalidated device data.  Last Pain:  Vitals:   06/11/18 0740  PainSc: 0-No pain      Patients Stated Pain Goal: 3 (43/27/61 4709)  Complications: No apparent anesthesia complications

## 2018-06-11 NOTE — Progress Notes (Signed)
Orthopedic Tech Progress Note Patient Details:  Antonio Wang [birth year] [id number]  Ortho Devices Type of Ortho Device: Soft collar Ortho Device/Splint Interventions: Application   Post Interventions Patient Tolerated: Well Instructions Provided: Care of device, Adjustment of device   Melony Overly T 06/11/2018, 7:49 PM

## 2018-06-11 NOTE — Anesthesia Preprocedure Evaluation (Addendum)
Anesthesia Evaluation  Patient identified by MRN, date of birth, ID band Patient awake    Reviewed: Allergy & Precautions, NPO status , Patient's Chart, lab work & pertinent test results, Unable to perform ROS - Chart review only  Airway Mallampati: II  TM Distance: >3 FB Neck ROM: Full    Dental  (+) Teeth Intact,    Pulmonary    Pulmonary exam normal        Cardiovascular hypertension, Pt. on medications Normal cardiovascular exam     Neuro/Psych    GI/Hepatic   Endo/Other    Renal/GU      Musculoskeletal   Abdominal   Peds  Hematology   Anesthesia Other Findings   Reproductive/Obstetrics                            Anesthesia Physical Anesthesia Plan  ASA: III  Anesthesia Plan: General   Post-op Pain Management:    Induction: Intravenous  PONV Risk Score and Plan: 2 and Ondansetron, Dexamethasone and Midazolam  Airway Management Planned: Oral ETT  Additional Equipment:   Intra-op Plan:   Post-operative Plan: Extubation in OR  Informed Consent: I have reviewed the patients History and Physical, chart, labs and discussed the procedure including the risks, benefits and alternatives for the proposed anesthesia with the patient or authorized representative who has indicated his/her understanding and acceptance.     Plan Discussed with: CRNA and Surgeon  Anesthesia Plan Comments:        Anesthesia Quick Evaluation

## 2018-06-11 NOTE — Progress Notes (Signed)
Pharmacy Antibiotic Note  Antonio Wang is a 62 y.o. male admitted on 06/11/2018 for surgery.   Marland Kitchen He is s/p anterior cervical decompression fusion today.  Pharmacy has been consulted for Vancomycin cosing for 1 dose 12 hours post-op unless patient has a drain, then continue vancomycin until discontinued by physician.  --No drain documented and confirmed with RN.  - Vancomycin 1500 mg preop dose given at 0945 today.  SCr 1.39, CrCl 76.6 ml/min   Plan: Give Vancomycin  1250mg  IV x1 @ 22:00 tonight.  Pharmacy will sign off.  Please consult pharmacy again if further antibiotic dosing needed.    Height: 5' 10.5" (179.1 cm) Weight: (!) 310 lb 13.6 oz (141 kg) IBW/kg (Calculated) : 74.15  Temp (24hrs), Avg:97.7 F (36.5 C), Min:97.6 F (36.4 C), Max:97.7 F (36.5 C)  No results for input(s): WBC, CREATININE, LATICACIDVEN, VANCOTROUGH, VANCOPEAK, VANCORANDOM, GENTTROUGH, GENTPEAK, GENTRANDOM, TOBRATROUGH, TOBRAPEAK, TOBRARND, AMIKACINPEAK, AMIKACINTROU, AMIKACIN in the last 168 hours.  Estimated Creatinine Clearance: 76.6 mL/min (A) (by C-G formula based on SCr of 1.39 mg/dL (H)).    Allergies  Allergen Reactions  . Oxycodone Hives    Dilaudid is ok to take    . Penicillins Swelling and Other (See Comments)    Has patient had a PCN reaction causing immediate rash, facial/tongue/throat swelling, SOB or lightheadedness with hypotension: Unknown Has patient had a PCN reaction causing severe rash involving mucus membranes or skin necrosis: No Has patient had a PCN reaction that required hospitalization: Yes Has patient had a PCN reaction occurring within the last 10 years: No If all of the above answers are "NO", then may proceed with Cephalosporin use.   . Sulfa Antibiotics Other (See Comments)    Unknown     Thank you for allowing pharmacy to be a part of this patient's care.  Antonio Wang, RPh Clinical Pharmacist Pager: [phone] Please check AMION for all [name] phone  numbers After 10:00 PM, call [place] [phone] 06/11/2018 4:21 PM

## 2018-06-11 NOTE — Op Note (Signed)
06/11/2018  12:51 PM  PATIENT:  Antonio Wang  62 y.o. 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 Decompressive anterior cervical discectomy C4-5 C5-6 C6-7, 2. Anterior cervical arthrodesis C4-5 C5-6 C6-7 utilizing a porous titanium interbody cage packed with locally harvested morcellized autologous bone graft, 3. Anterior cervical plating C4-C7 inclusive utilizing a Alphatec plate  SURGEON:  Sherley Bounds, MD  ASSISTANTS: Glenford Peers, FNP  ANESTHESIA:   General  EBL: 150 ml  Total I/O In: 600 [I.V.:600] Out: 150 [Blood:150]  BLOOD ADMINISTERED: none  DRAINS: none  SPECIMEN:  none  INDICATION FOR PROCEDURE: This patient presented with neck and bilateral arm pain with numbness and tingling. Imaging showed focal spondylosis C4-5 C5-6 C6-7. The patient tried conservative measures without relief. Pain was debilitating. Recommended ACDF with plating. Patient understood the risks, benefits, and alternatives and potential outcomes and wished to proceed.  PROCEDURE DETAILS: Patient was brought to the operating room placed under general endotracheal anesthesia. Patient was placed in the supine position on the operating room table. The neck was prepped with Duraprep and draped in a sterile fashion.   Three cc of local anesthesia was injected and a transverse incision was made on the right side of the neck.  Dissection was carried down thru the subcutaneous tissue and the platysma was  elevated, opened, and undermined with Metzenbaum scissors.  Dissection was then carried out thru an avascular plane leaving the sternocleidomastoid carotid artery and jugular vein laterally and the trachea and esophagus medially. The ventral aspect of the vertebral column was identified and a localizing x-ray was taken. The C4-5 level was identified.  The longus colli muscles were then elevated and the retractor was placed to expose C4-C7.  I used distraction pins at each level.  The annulus was incised at each level and the disc space entered.  Exact same decompression was performed at all 3 levels.  Discectomy was performed with micro-curettes and pituitary rongeurs. I then used the high-speed drill to drill the endplates down to the level of the posterior longitudinal ligament. The drill shavings were saved in a mucous trap for later arthrodesis. The operating microscope was draped and brought into the field provided additional magnification, illumination and visualization. Discectomy was continued posteriorly thru the disc space. Posterior longitudinal ligament was opened with a nerve hook, and then removed along with disc herniation and osteophytes, decompressing the spinal canal and thecal sac. We then continued to remove osteophytic overgrowth and disc material decompressing the neural foramina and exiting nerve roots bilaterally. The scope was angled up and down to help decompress and undercut the vertebral bodies. Once the decompression was completed we could pass a nerve hook circumferentially to assure adequate decompression in the midline and in the neural foramina. So by both visualization and palpation we felt we had an adequate decompression of the neural elements. We then measured the height of the intravertebral disc space and selected a 8 millimeter's titanium interbody cage packed with autograft. It was then gently positioned in the intravertebral disc space(s) and countersunk. I then used a T1 millimeter Alphatec plate and placed variable angle screws into the vertebral bodies of each level and locked them into position. The wound was irrigated with bacitracin solution, checked for hemostasis which was established and confirmed. Once meticulous hemostasis was achieved, we then proceeded with closure. The platysma was closed  with interrupted  3-0 undyed Vicryl suture, the subcuticular layer was closed with interrupted 3-0 undyed Vicryl suture. The skin edges were approximated with steristrips. The drapes were removed. A sterile dressing was applied. The patient was then awakened from general anesthesia and transferred to the recovery room in stable condition. At the end of the procedure all sponge, needle and instrument counts were correct.   PLAN OF CARE: Admit for overnight observation  PATIENT DISPOSITION:  PACU - hemodynamically stable.   Delay start of Pharmacological VTE agent (>24hrs) due to surgical blood loss or risk of bleeding:  yes

## 2018-06-11 NOTE — Anesthesia Procedure Notes (Addendum)
Procedure Name: Intubation Date/Time: 06/11/2018 9:29 AM Performed by: Kyung Rudd, CRNA Pre-anesthesia Checklist: Patient identified, Emergency Drugs available, Suction available and Patient being monitored Patient Re-evaluated:Patient Re-evaluated prior to induction Oxygen Delivery Method: Circle system utilized Preoxygenation: Pre-oxygenation with 100% oxygen Induction Type: IV induction and Rapid sequence Laryngoscope Size: Mac and 4 Grade View: Grade II Tube type: Oral Tube size: 7.5 mm Number of attempts: 1 Airway Equipment and Method: Stylet Placement Confirmation: ETT inserted through vocal cords under direct vision,  positive ETCO2 and breath sounds checked- equal and bilateral Secured at: 22 cm Tube secured with: Tape Dental Injury: Teeth and Oropharynx as per pre-operative assessment

## 2018-06-12 ENCOUNTER — Encounter (HOSPITAL_COMMUNITY): Payer: Self-pay | Admitting: Neurological Surgery

## 2018-06-12 DIAGNOSIS — M2578 Osteophyte, vertebrae: Secondary | ICD-10-CM | POA: Diagnosis present

## 2018-06-12 DIAGNOSIS — M47812 Spondylosis without myelopathy or radiculopathy, cervical region: Secondary | ICD-10-CM | POA: Diagnosis present

## 2018-06-12 DIAGNOSIS — Z882 Allergy status to sulfonamides status: Secondary | ICD-10-CM | POA: Diagnosis not present

## 2018-06-12 DIAGNOSIS — Z96659 Presence of unspecified artificial knee joint: Secondary | ICD-10-CM | POA: Diagnosis present

## 2018-06-12 DIAGNOSIS — Z85820 Personal history of malignant melanoma of skin: Secondary | ICD-10-CM | POA: Diagnosis not present

## 2018-06-12 DIAGNOSIS — M4802 Spinal stenosis, cervical region: Secondary | ICD-10-CM | POA: Diagnosis present

## 2018-06-12 DIAGNOSIS — F17290 Nicotine dependence, other tobacco product, uncomplicated: Secondary | ICD-10-CM | POA: Diagnosis present

## 2018-06-12 DIAGNOSIS — I10 Essential (primary) hypertension: Secondary | ICD-10-CM | POA: Diagnosis present

## 2018-06-12 DIAGNOSIS — Z880 Allergy status to penicillin: Secondary | ICD-10-CM | POA: Diagnosis not present

## 2018-06-12 DIAGNOSIS — Z6841 Body Mass Index (BMI) 40.0 and over, adult: Secondary | ICD-10-CM | POA: Diagnosis not present

## 2018-06-12 DIAGNOSIS — M50221 Other cervical disc displacement at C4-C5 level: Secondary | ICD-10-CM | POA: Diagnosis present

## 2018-06-12 DIAGNOSIS — Z885 Allergy status to narcotic agent status: Secondary | ICD-10-CM | POA: Diagnosis not present

## 2018-06-12 DIAGNOSIS — Z79899 Other long term (current) drug therapy: Secondary | ICD-10-CM | POA: Diagnosis not present

## 2018-06-12 MED ORDER — METHOCARBAMOL 500 MG PO TABS: 500.0000 mg | ORAL_TABLET | Freq: Three times a day (TID) | ORAL | 1 refills | Status: AC | PRN

## 2018-06-12 MED ORDER — HYDROMORPHONE HCL 2 MG PO TABS: 2.0000 mg | ORAL_TABLET | Freq: Four times a day (QID) | ORAL | 0 refills | Status: AC | PRN

## 2018-06-12 MED FILL — Thrombin (Recombinant) For Soln 20000 Unit: CUTANEOUS | Qty: 1 | Status: AC

## 2018-06-12 NOTE — Progress Notes (Signed)
Discharge to home, d/c instructions discussed with patient ,verbalized understanding. Discharge ambulatory with soft collar on.

## 2018-06-12 NOTE — Discharge Instructions (Signed)
Wound Care °Leave incision open to air. °You may shower. °Do not scrub directly on incision.  °Do not put any creams, lotions, or ointments on incision. °Activity °Walk each and every day, increasing distance each day. °No lifting greater than 5 lbs.  Avoid excessive neck motion. °No driving for 2 weeks; may ride as a passenger locally. °Wear neck brace at all times except when showering.   °Diet °Resume your normal diet.  °Return to Work °Will be discussed at you follow up appointment. °Call Your Doctor If Any of These Occur °Redness, drainage, or swelling at the wound.  °Temperature greater than 101 degrees. °Severe pain not relieved by pain medication. °Increased difficulty swallowing. °Incision starts to come apart. °Follow Up Appt °Call today for appointment in 1-2 weeks ([phone]) or for problems.  If you have any hardware placed in your spine, you will need an x-ray before your appointment. °

## 2018-06-12 NOTE — Progress Notes (Signed)
Set pt up on CPAP with M/L nasal mask on auto mode (max of 14 and min of 5) and added H2O to water chamber. Pt tolerating well.

## 2018-06-12 NOTE — Discharge Summary (Signed)
Physician Discharge Summary  Patient ID: Antonio Wang MRN: [id number] DOB/AGE: 1955-09-13 62 y.o.  Admit date: 06/11/2018 Discharge date: 06/12/2018  Admission Diagnoses: cervical stenosis   Discharge Diagnoses: same   Discharged Condition: good  Hospital Course: The patient was admitted on 06/11/2018 and taken to the operating room where the patient underwent ACDF. The patient tolerated the procedure well and was taken to the recovery room and then to the floor in stable condition. The hospital course was routine. There were no complications. The wound remained clean dry and intact. Pt had appropriate neck soreness. No complaints of arm pain or new N/T/W. The patient remained afebrile with stable vital signs, and tolerated a regular diet. The patient continued to increase activities, and pain was well controlled with oral pain medications.   Consults: None  Significant Diagnostic Studies:  Results for orders placed or performed during the hospital encounter of 06/02/18  Surgical pcr screen  Result Value Ref Range   MRSA, PCR NEGATIVE NEGATIVE   Staphylococcus aureus NEGATIVE NEGATIVE  Basic metabolic panel  Result Value Ref Range   Sodium 139 135 - 145 mmol/L   Potassium 4.0 3.5 - 5.1 mmol/L   Chloride 107 98 - 111 mmol/L   CO2 26 22 - 32 mmol/L   Glucose, Bld 129 (H) 70 - 99 mg/dL   BUN 17 8 - 23 mg/dL   Creatinine, Ser 1.39 (H) 0.61 - 1.24 mg/dL   Calcium 9.4 8.9 - 10.3 mg/dL   GFR calc non Af Amer 52 (L) >60 mL/min   GFR calc Af Amer >60 >60 mL/min   Anion gap 6 5 - 15  CBC WITH DIFFERENTIAL  Result Value Ref Range   WBC 8.0 4.0 - 10.5 K/uL   RBC 4.38 4.22 - 5.81 MIL/uL   Hemoglobin 13.8 13.0 - 17.0 g/dL   HCT 41.3 39.0 - 52.0 %   MCV 94.3 80.0 - 100.0 fL   MCH 31.5 26.0 - 34.0 pg   MCHC 33.4 30.0 - 36.0 g/dL   RDW 12.5 11.5 - 15.5 %   Platelets 204 150 - 400 K/uL   nRBC 0.0 0.0 - 0.2 %   Neutrophils Relative % 57 %   Neutro Abs 4.7 1.7 - 7.7 K/uL   Lymphocytes Relative 25 %   Lymphs Abs 2.0 0.7 - 4.0 K/uL   Monocytes Relative 11 %   Monocytes Absolute 0.8 0.1 - 1.0 K/uL   Eosinophils Relative 5 %   Eosinophils Absolute 0.4 0.0 - 0.5 K/uL   Basophils Relative 1 %   Basophils Absolute 0.1 0.0 - 0.1 K/uL   Immature Granulocytes 1 %   Abs Immature Granulocytes 0.04 0.00 - 0.07 K/uL  Protime-INR  Result Value Ref Range   Prothrombin Time 12.7 11.4 - 15.2 seconds   INR 0.96     Chest 2 View  Result Date: 06/03/2018 CLINICAL DATA:  Cervical spine surgery. EXAM: CHEST - 2 VIEW COMPARISON:  Chest x-ray 08/14/2010. FINDINGS: Mediastinum and hilar structures are normal. Lungs are clear. No pleural effusion pneumothorax. Heart size stable. No acute bony abnormality. Degenerative change thoracic spine. IMPRESSION: No acute cardiopulmonary disease. Electronically Signed   By: Marcello Moores  Register   On: 06/03/2018 09:04   Dg Cervical Spine 2-3 Views  Result Date: 06/11/2018 CLINICAL DATA:  Anterior fusion from C4-C7 EXAM: CERVICAL SPINE - 2-3 VIEW COMPARISON:  MR C-spine 02/27/2018 FINDINGS: Three C-arm spot films show anterior fusion plate from Y8-M5. Interbody fusion devices are noted at C4-5,  C5-6, and C6-7 levels in good position. No complicating features are seen. IMPRESSION: Anterior fusion from T1-X7 with no complicating features. Electronically Signed   By: Ivar Drape M.D.   On: 06/11/2018 14:24   Dg C-arm 1-60 Min  Result Date: 06/11/2018 CLINICAL DATA:  Anterior cervical spine fusion from C4-C7 t EXAM: DG C-ARM 61-120 MIN COMPARISON:  MR cervical spine of 02/27/2018 FINDINGS: C-arm fluoroscopy was provided during anterior fusion. Fluoroscopy time of 23 seconds was recorded. IMPRESSION: C-arm fluoroscopy provided. Electronically Signed   By: Ivar Drape M.D.   On: 06/11/2018 14:23    Antibiotics:  Anti-infectives (From admission, onward)   Start     Dose/Rate Route Frequency Ordered Stop   06/11/18 2200  vancomycin (VANCOCIN) 1,250 mg  in sodium chloride 0.9 % 250 mL IVPB     1,250 mg 166.7 mL/hr over 90 Minutes Intravenous  Once 06/11/18 1629 06/11/18 2302   06/11/18 0945  vancomycin (VANCOCIN) 1,500 mg in sodium chloride 0.9 % 500 mL IVPB     1,500 mg 250 mL/hr over 120 Minutes Intravenous To Surgery 06/11/18 0939 06/11/18 1145   06/11/18 0900  bacitracin 50,000 Units in sodium chloride 0.9 % 500 mL irrigation  Status:  Discontinued       As needed 06/11/18 1012 06/11/18 1248   06/11/18 0715  ceFAZolin (ANCEF) 2-4 GM/100ML-% IVPB  Status:  Discontinued    Note to Pharmacy:  Bobbie Stack   : cabinet override      06/11/18 0715 06/11/18 0755      Discharge Exam: Blood pressure (!) 144/72, pulse 80, temperature 99.8 F (37.7 C), temperature source Oral, resp. rate 18, height 5' 10.5" (1.791 m), weight (!) 141 kg, SpO2 94 %. Neurologic: Grossly normal Dressing dry  Discharge Medications:   Allergies as of 06/12/2018      Reactions   Oxycodone Hives   Dilaudid is ok to take     Penicillins Swelling, Other (See Comments)   Has patient had a PCN reaction causing immediate rash, facial/tongue/throat swelling, SOB or lightheadedness with hypotension: Unknown Has patient had a PCN reaction causing severe rash involving mucus membranes or skin necrosis: No Has patient had a PCN reaction that required hospitalization: Yes Has patient had a PCN reaction occurring within the last 10 years: No If all of the above answers are "NO", then may proceed with Cephalosporin use.   Sulfa Antibiotics Other (See Comments)   Unknown      Medication List    TAKE these medications   atorvastatin 40 MG tablet Commonly known as:  LIPITOR Take 40 mg by mouth daily.   HYDROmorphone 2 MG tablet Commonly known as:  DILAUDID Take 1 tablet (2 mg total) by mouth every 6 (six) hours as needed for severe pain.   losartan 50 MG tablet Commonly known as:  COZAAR Take 50 mg by mouth daily.   methocarbamol 500 MG tablet Commonly known as:   ROBAXIN Take 1 tablet (500 mg total) by mouth every 8 (eight) hours as needed for muscle spasms.   TURMERIC CURCUMIN PO Take 2 capsules by mouth daily.       Disposition: home   Final Dx: ACDF C4-5 C5-6 C6-7  Discharge Instructions     Remove dressing in 72 hours   Complete by:  As directed    Call MD for:  difficulty breathing, headache or visual disturbances   Complete by:  As directed    Call MD for:  persistant nausea and vomiting  Complete by:  As directed    Call MD for:  redness, tenderness, or signs of infection (pain, swelling, redness, odor or green/yellow discharge around incision site)   Complete by:  As directed    Call MD for:  severe uncontrolled pain   Complete by:  As directed    Call MD for:  temperature >100.4   Complete by:  As directed    Diet - low sodium heart healthy   Complete by:  As directed    Increase activity slowly   Complete by:  As directed          Signed: Eleena Grater S 06/12/2018, 12:12 PM

## 2020-06-20 IMAGING — RF DG CERVICAL SPINE 2 OR 3 VIEWS
1 series · 3 of 3 positions shown · non-contrast
Comparison: MR Stephan 02/27/2018

CLINICAL DATA: Anterior fusion from C4-C7

EXAM:
CERVICAL SPINE - 2-3 VIEW

[Series 1: run · 3 of 3 slices shown]
[im 1/3]
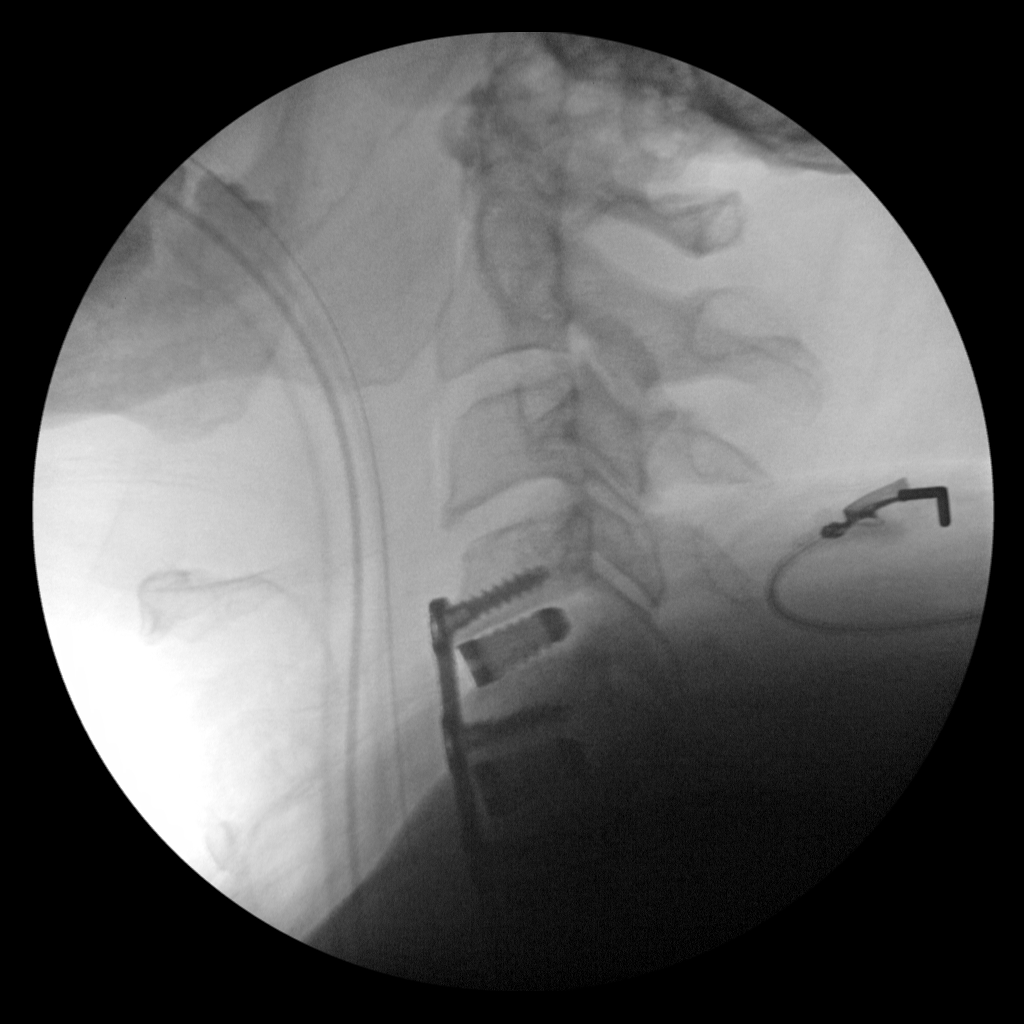
[im 2/3]
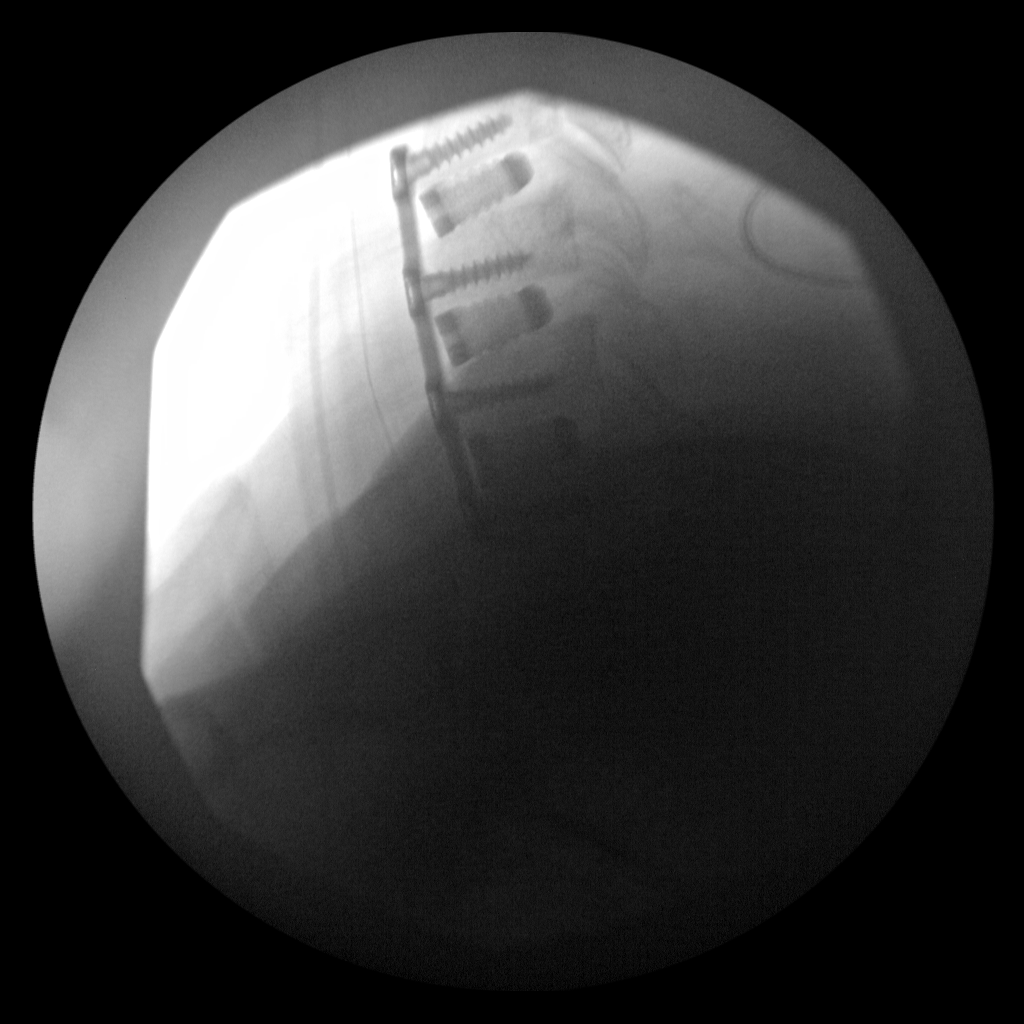
[im 3/3]
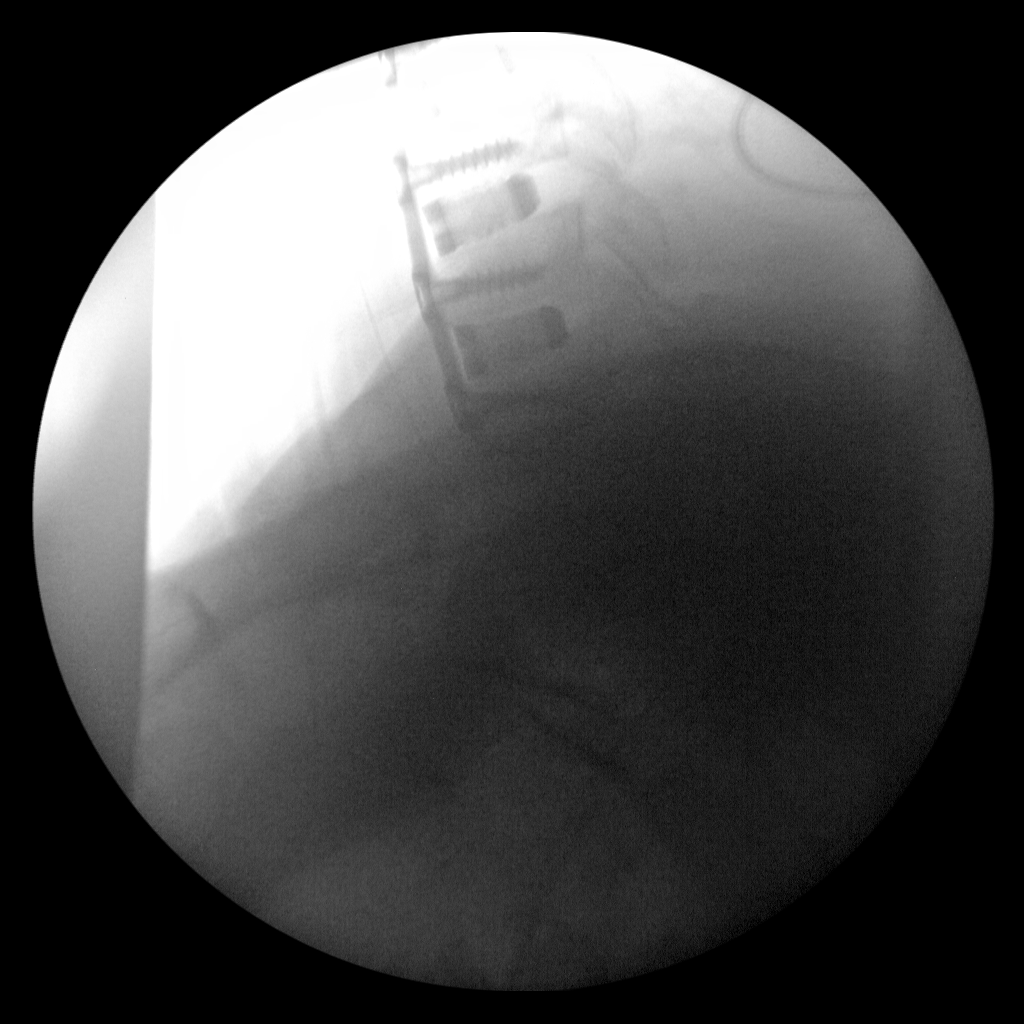

[3 of 3 positions shown; findings below may reference images not displayed]

FINDINGS: Three C-arm spot films show anterior fusion plate from C4-C7.
Interbody fusion devices are noted at C4-5, C5-6, and C6-7 levels in
good position. No complicating features are seen.
IMPRESSION: Anterior fusion from C4-C7 with no complicating features.
